# Patient Record
Sex: Male | Born: 1985 | Race: Black or African American | Hispanic: No | Marital: Single | Smoking: Never smoker
Health system: Southern US, Community
[De-identification: ages and names within clinical notes are randomized; demographics above are authoritative.]

---
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
## 2009-12-22 ENCOUNTER — Ambulatory Visit: Payer: Self-pay | Admitting: Internal Medicine

## 2009-12-22 LAB — CONVERTED CEMR LAB
Cholesterol: 118 mg/dL (ref 0–200)
HDL: 33 mg/dL — ABNORMAL LOW (ref >39.00)
LDL Cholesterol: 78 mg/dL (ref 0–99)
Total CHOL/HDL Ratio: 4
Triglycerides: 37 mg/dL (ref 0.0–149.0)
VLDL: 7.4 mg/dL (ref 0.0–40.0)

## 2009-12-23 ENCOUNTER — Encounter: Payer: Self-pay | Admitting: Internal Medicine

## 2010-07-06 NOTE — Letter (Signed)
Summary: Lipid Letter  [facility]  [address] [place], [place] [address]   Phone: [phone]  Fax: [phone]    12/23/2009  Destan Franchini [address] [place], [place]  [address]  Dear Jamas Lav:  We have carefully reviewed your last lipid profile from 12/22/2009 and the results are noted below with a summary of recommendations for lipid management.    Cholesterol:       118     Goal: <200   HDL "good" Cholesterol:   57.84     Goal: >40   LDL "bad" Cholesterol:   78     Goal: <130   Triglycerides:       37.0     Goal: <150    these are excellent results    TLC Diet (Therapeutic Lifestyle Change): Saturated Fats & Transfatty acids should be kept < 7% of total calories ***Reduce Saturated Fats Polyunstaurated Fat can be up to 10% of total calories Monounsaturated Fat Fat can be up to 20% of total calories Total Fat should be no greater than 25-35% of total calories Carbohydrates should be 50-60% of total calories Protein should be approximately 15% of total calories Fiber should be at least 20-30 grams a day ***Increased fiber may help lower LDL Total Cholesterol should be < 200mg /day Consider adding plant stanol/sterols to diet (example: Benacol spread) ***A higher intake of unsaturated fat may reduce Triglycerides and Increase HDL    Adjunctive Measures (may lower LIPIDS and reduce risk of Heart Attack) include: Aerobic Exercise (20-30 minutes 3-4 times a week) Limit Alcohol Consumption Weight Reduction Aspirin 75-81 mg a day by mouth (if not allergic or contraindicated) Dietary Fiber 20-30 grams a day by mouth     Current Medications:  None If you have any questions, please call. We appreciate being able to work with you.   Sincerely,    [facility] Etta Grandchild MD

## 2010-07-06 NOTE — Assessment & Plan Note (Signed)
Summary: new pt cpx-lb   Vital Signs:  Patient profile:   25 year old male Height:      67 inches Weight:      180.25 pounds BMI:     28.33 O2 Sat:      98 % Temp:     97.4 degrees F oral Pulse rate:   55 / minute Pulse rhythm:   regular Resp:     16 per minute BP sitting:   112 / 70  (left arm) Cuff size:   large  Vitals Entered By: Rock Nephew CMA (December 22, 2009 10:43 AM)  Nutrition Counseling: Patient's BMI is greater than 25 and therefore counseled on weight management options. CC: CPX w/ lab/ Is Patient Diabetic? No Pain Assessment Patient in pain? no        Primary Care Provider:  Etta Grandchild MD  CC:  CPX w/ lab/.  History of Present Illness: New to me for a complete physical - no complaints.  Preventive Screening-Counseling & Management  Alcohol-Tobacco     Alcohol drinks/day: <1     Alcohol type: beer     >5/day in last 3 mos: no     Alcohol Counseling: not indicated; use of alcohol is not excessive or problematic     Feels need to cut down: no     Feels annoyed by complaints: no     Feels guilty re: drinking: no     Needs 'eye opener' in am: no     Smoking Status: never  Caffeine-Diet-Exercise     Does Patient Exercise: yes  Hep-HIV-STD-Contraception     Hepatitis Risk: no risk noted     HIV Risk: no risk noted     STD Risk: no risk noted     TSE monthly: yes     Testicular SE Education/Counseling to perform regular STE  Safety-Violence-Falls     Seat Belt Use: yes     Helmet Use: yes     Firearms in the Home: firearms in the home     Smoke Detectors: yes     Violence in the Home: no risk noted     Sexual Abuse: no      Sexual History:  currently monogamous.        Drug Use:  no.        Blood Transfusions:  no.    Current Medications (verified): 1)  None  Allergies (verified): No Known Drug Allergies  Past History:  Past Medical History: Unremarkable  Past Surgical History: Denies surgical history  Family  History: Family History Diabetes 1st degree relative Family History Hypertension  Social History: Occupation: IE Consulting civil engineer at [employer] Never Smoked Alcohol use-yes Drug use-no Regular exercise-yes Smoking Status:  never Hepatitis Risk:  no risk noted HIV Risk:  no risk noted STD Risk:  no risk noted Seat Belt Use:  yes Sexual History:  currently monogamous Blood Transfusions:  no Does Patient Exercise:  yes Drug Use:  no  Review of Systems  The patient denies anorexia, fever, weight loss, weight gain, chest pain, syncope, dyspnea on exertion, peripheral edema, prolonged cough, headaches, hemoptysis, abdominal pain, suspicious skin lesions, difficulty walking, depression, enlarged lymph nodes, and testicular masses.    Physical Exam  General:  Well-developed,well-nourished,in no acute distress; alert,appropriate and cooperative throughout examination Head:  Normocephalic and atraumatic without obvious abnormalities. No apparent alopecia or balding. Eyes:  No corneal or conjunctival inflammation noted. EOMI. Perrla. Funduscopic exam benign, without hemorrhages, exudates  or papilledema. Vision grossly normal. Ears:  External ear exam shows no significant lesions or deformities.  Otoscopic examination reveals clear canals, tympanic membranes are intact bilaterally without bulging, retraction, inflammation or discharge. Hearing is grossly normal bilaterally. Mouth:  Oral mucosa and oropharynx without lesions or exudates.  Teeth in good repair. Neck:  No deformities, masses, or tenderness noted. Lungs:  Normal respiratory effort, chest expands symmetrically. Lungs are clear to auscultation, no crackles or wheezes. Heart:  Normal rate and regular rhythm. S1 and S2 normal without gallop, murmur, click, rub or other extra sounds. Abdomen:  soft, non-tender, normal bowel sounds, no distention, no masses, no guarding, no rigidity, no rebound tenderness, no abdominal hernia, no inguinal  hernia, no hepatomegaly, and no splenomegaly.   Genitalia:  circumcised, no hydrocele, no varicocele, no scrotal masses, no testicular masses or atrophy, no cutaneous lesions, and no urethral discharge.   Msk:  normal ROM, no joint tenderness, no joint swelling, no joint warmth, no redness over joints, no joint deformities, no joint instability, no crepitation, and no muscle atrophy.   Pulses:  R and L carotid,radial,femoral,dorsalis pedis and posterior tibial pulses are full and equal bilaterally Extremities:  No clubbing, cyanosis, edema, or deformity noted with normal full range of motion of all joints.   Neurologic:  No cranial nerve deficits noted. Station and gait are normal. Plantar reflexes are down-going bilaterally. DTRs are symmetrical throughout. Sensory, motor and coordinative functions appear intact. Skin:  turgor normal, color normal, no rashes, no suspicious lesions, no ecchymoses, no petechiae, no purpura, no ulcerations, no edema, and tattoo(s).   Cervical Nodes:  no anterior cervical adenopathy and no posterior cervical adenopathy.   Axillary Nodes:  no R axillary adenopathy and no L axillary adenopathy.   Inguinal Nodes:  no R inguinal adenopathy and no L inguinal adenopathy.   Psych:  Cognition and judgment appear intact. Alert and cooperative with normal attention span and concentration. No apparent delusions, illusions, hallucinations   Impression & Recommendations:  Problem # 1:  ROUTINE GENERAL MEDICAL EXAM@HEALTH  CARE FACL (ICD-V70.0) Assessment New  Orders: Venipuncture (36644) TLB-Lipid Panel (80061-LIPID)  Td Booster: Historical (06/06/2002)    Discussed using sunscreen, use of alcohol, drug use, self testicular exam, routine dental care, routine eye care, routine physical exam, seat belts, multiple vitamins, and recommendations for immunizations.  Discussed exercise and checking cholesterol.    Patient Instructions: 1)  Please schedule a follow-up appointment  as needed. 2)  If you could be exposed to sexually transmitted diseases, you should use a condom.  Preventive Care Screening  Last Tetanus Booster:    Date:  06/06/2002    Results:  Historical

## 2012-03-18 ENCOUNTER — Ambulatory Visit (INDEPENDENT_AMBULATORY_CARE_PROVIDER_SITE_OTHER): Payer: BC Managed Care – PPO | Admitting: Family Medicine

## 2012-03-18 VITALS — BP 102/62 | HR 70 | Temp 98.4°F | Resp 16 | Ht 67.0 in | Wt 178.0 lb

## 2012-03-18 DIAGNOSIS — R300 Dysuria: Secondary | ICD-10-CM

## 2012-03-18 DIAGNOSIS — N390 Urinary tract infection, site not specified: Secondary | ICD-10-CM

## 2012-03-18 LAB — POCT URINALYSIS DIPSTICK
Bilirubin, UA: NEGATIVE
Blood, UA: NEGATIVE
Glucose, UA: NEGATIVE
Ketones, UA: NEGATIVE
Leukocytes, UA: NEGATIVE
Nitrite, UA: NEGATIVE
Protein, UA: NEGATIVE
Spec Grav, UA: 1.02
Urobilinogen, UA: 1
pH, UA: 7

## 2012-03-18 LAB — POCT UA - MICROSCOPIC ONLY
Casts, Ur, LPF, POC: NEGATIVE
Crystals, Ur, HPF, POC: NEGATIVE
Yeast, UA: NEGATIVE

## 2012-03-18 MED ORDER — CIPROFLOXACIN HCL 500 MG PO TABS: 500.0000 mg | ORAL_TABLET | Freq: Two times a day (BID) | ORAL | Status: DC

## 2012-03-18 NOTE — Progress Notes (Signed)
Subjective:    Patient ID: Dean Parrish, male    DOB: 11-05-1985, 26 y.o.   MRN: [id number]  HPI  Has had several wks of pain in urethra w/ urination, intermittent. worse when he has to urinate badly - when his bladder feels very full, it is more painful to urinate.  He can almost feel a certain point in his urethra where the pain is but no other sxs. doesn't feel as bad when he drinks cranberry juice.  Has had 2-3x in the morning of some white discharge on meatus but was after sex so he just assumed it was dried semun.  Uses condoms everytime - very religious about this. Had neg STI test at [facility] about 3 mos prev - was tested through urine.  He did recently change back to latex condoms from polyurethane and one night before sxs started he fell asleep w/ the condom on all night. Has had a swollen left inguinal node for sev mos but not tender or enlarging  Had genital probe test done sev yrs ago and was very painful for pt.  He uses condoms everytime and declines having this test done again.  No past medical history on file.   Review of Systems  Constitutional: Negative for fever, chills, diaphoresis, activity change, appetite change, fatigue and unexpected weight change.  Gastrointestinal: Negative for nausea, vomiting, abdominal pain, diarrhea and constipation.  Genitourinary: Positive for dysuria and discharge. Negative for urgency, frequency, hematuria, flank pain, decreased urine volume, penile swelling, scrotal swelling, enuresis, difficulty urinating, genital sores, penile pain and testicular pain.  Skin: Negative for color change and rash.  Hematological: Positive for adenopathy. Does not bruise/bleed easily.       BP 102/62  Pulse 70  Temp 98.4 F (36.9 C) (Oral)  Resp 16  Ht 5\' 7"  (1.702 m)  Wt 178 lb (80.74 kg)  BMI 27.88 kg/m2  SpO2 100%  Objective:   Physical Exam  Constitutional: He is oriented to person, place, and time. He appears well-developed and well-nourished. No  distress.  HENT:  Head: Normocephalic and atraumatic.  Right Ear: External ear normal.  Left Ear: External ear normal.  Eyes: Conjunctivae normal are normal. No scleral icterus.  Neck: Normal range of motion. Neck supple.  Cardiovascular: Normal rate and regular rhythm.   Pulmonary/Chest: Effort normal and breath sounds normal.  Abdominal: There is no CVA tenderness.  Musculoskeletal: He exhibits no edema.  Neurological: He is alert and oriented to person, place, and time.  Skin: Skin is warm and dry. No rash noted. He is not diaphoretic.  Psychiatric: He has a normal mood and affect. His behavior is normal.          Results for orders placed in visit on 03/18/12  POCT UA - MICROSCOPIC ONLY      Component Value Range   WBC, Ur, HPF, POC 8-12     RBC, urine, microscopic 0-2     Bacteria, U Microscopic small     Mucus, UA trace     Epithelial cells, urine per micros 2-3     Crystals, Ur, HPF, POC neg     Casts, Ur, LPF, POC neg     Yeast, UA neg    POCT URINALYSIS DIPSTICK      Component Value Range   Color, UA yellow     Clarity, UA clear     Glucose, UA neg     Bilirubin, UA neg     Ketones, UA neg  Spec Grav, UA 1.020     Blood, UA neg     pH, UA 7.0     Protein, UA neg     Urobilinogen, UA 1.0     Nitrite, UA neg     Leukocytes, UA Negative      Assessment & Plan:  1. Urethritis - Cover w/ cipro which urine culture is pending. Thoroughly discussed potential for false negative results on urine sti testing but pt declines repeat testing w/ probe. He will RTC after completing abx for repeat gc/chlam urine testing with dirty catch of first a.m. void.  Cont to use condoms everytime.

## 2012-03-19 LAB — URINE CULTURE
Colony Count: NO GROWTH
Organism ID, Bacteria: NO GROWTH

## 2012-03-21 ENCOUNTER — Emergency Department (HOSPITAL_COMMUNITY)
Admission: EM | Admit: 2012-03-21 | Discharge: 2012-03-22 | Disposition: A | Discharge status: Home / Self Care | Payer: BC Managed Care – PPO | Attending: Emergency Medicine | Admitting: Emergency Medicine

## 2012-03-21 ENCOUNTER — Encounter (HOSPITAL_COMMUNITY): Payer: Self-pay | Admitting: *Deleted

## 2012-03-21 DIAGNOSIS — A64 Unspecified sexually transmitted disease: Secondary | ICD-10-CM | POA: Insufficient documentation

## 2012-03-21 DIAGNOSIS — N342 Other urethritis: Secondary | ICD-10-CM

## 2012-03-21 LAB — URINALYSIS, ROUTINE W REFLEX MICROSCOPIC
Bilirubin Urine: NEGATIVE
Glucose, UA: NEGATIVE mg/dL
Hgb urine dipstick: NEGATIVE
Ketones, ur: NEGATIVE mg/dL
Nitrite: NEGATIVE
Protein, ur: NEGATIVE mg/dL
Specific Gravity, Urine: 1.024 (ref 1.005–1.030)
Urobilinogen, UA: 0.2 mg/dL (ref 0.0–1.0)
pH: 6.5 (ref 5.0–8.0)

## 2012-03-21 LAB — URINE MICROSCOPIC-ADD ON

## 2012-03-21 NOTE — ED Notes (Signed)
Painful urnation for 1-2 weeks.  He was seen at [facility] Sunday and he was given an antibiotic and he has not yet finished it yet

## 2012-03-22 MED ORDER — CEFTRIAXONE SODIUM 250 MG IJ SOLR
250.0000 mg | Freq: Once | INTRAMUSCULAR | Status: AC
  Administered 2012-03-22: 250 mg via INTRAMUSCULAR
  Filled 2012-03-22: qty 250

## 2012-03-22 MED ORDER — LIDOCAINE HCL (PF) 1 % IJ SOLN
INTRAMUSCULAR | Status: AC
  Administered 2012-03-22: 5 mL
  Filled 2012-03-22: qty 5

## 2012-03-22 MED ORDER — METRONIDAZOLE 500 MG PO TABS
2000.0000 mg | ORAL_TABLET | Freq: Once | ORAL | Status: AC
  Administered 2012-03-22: 2000 mg via ORAL
  Filled 2012-03-22: qty 1

## 2012-03-22 MED ORDER — AZITHROMYCIN 250 MG PO TABS
1000.0000 mg | ORAL_TABLET | Freq: Once | ORAL | Status: AC
  Administered 2012-03-22: 1000 mg via ORAL
  Filled 2012-03-22: qty 4

## 2012-03-22 NOTE — ED Provider Notes (Signed)
Medical screening examination/treatment/procedure(s) were performed by non-physician practitioner and as supervising physician I was immediately available for consultation/collaboration.  Levante Simones L Ellaree Gear, MD 03/22/12 0640 

## 2012-03-22 NOTE — ED Provider Notes (Signed)
History     CSN: [id number]  Arrival date & time 03/21/12  2158   First MD Initiated Contact with Patient 03/21/12 2347      Chief Complaint  Patient presents with  . painful urination    HPI   history provided by the patient. Patient is a 26 year old male with no significant PMH who presents with complaints of dysuria and penile discharge. Symptoms present for the past one to 2 weeks. Patient states he was seen in urgent care Center last Sunday given a prescription for an antibiotic but states he has had no improvement in symptoms. Patient is sexually active with no prior history of STDs. He denies any rash of the skin. He denies any hematuria. Denies any flank pain. Denies any fever, chills or sweats. He denies any other aggravating or alleviating factors.    History reviewed. No pertinent past medical history.  History reviewed. No pertinent past surgical history.  No family history on file.  History  Substance Use Topics  . Smoking status: Never Smoker   . Smokeless tobacco: Not on file  . Alcohol Use: Yes     socially      Review of Systems  Constitutional: Negative for fever and chills.  Gastrointestinal: Negative for nausea, vomiting and abdominal pain.  Genitourinary: Positive for dysuria and discharge. Negative for frequency, hematuria, flank pain, genital sores and penile pain.    Allergies  Review of patient's allergies indicates no known allergies.  Home Medications   Current Outpatient Rx  Name Route Sig Dispense Refill  . CIPROFLOXACIN HCL 500 MG PO TABS Oral Take 1 tablet (500 mg total) by mouth 2 (two) times daily. 28 tablet 0    BP 112/58  Pulse 68  Temp 98.5 F (36.9 C) (Oral)  Resp 18  SpO2 98%  Physical Exam  Nursing note and vitals reviewed. Constitutional: He is oriented to person, place, and time. He appears well-developed and well-nourished. No distress.  HENT:  Head: Normocephalic.  Cardiovascular: Normal rate and regular rhythm.    Pulmonary/Chest: Effort normal and breath sounds normal.  Abdominal: Soft. There is no tenderness. There is no rebound and no guarding.       No CVA tenderness  Genitourinary: Right testis shows no mass and no tenderness. Left testis shows no mass and no tenderness. Circumcised. Discharge found.  Lymphadenopathy:       Right: No inguinal adenopathy present.       Left: Inguinal adenopathy present.  Neurological: He is alert and oriented to person, place, and time.  Skin: Skin is warm. No rash noted.  Psychiatric: He has a normal mood and affect. His behavior is normal.    ED Course  Procedures   Results for orders placed during the hospital encounter of 03/21/12  URINALYSIS, ROUTINE W REFLEX MICROSCOPIC      Component Value Range   Color, Urine YELLOW  YELLOW   APPearance CLEAR  CLEAR   Specific Gravity, Urine 1.024  1.005 - 1.030   pH 6.5  5.0 - 8.0   Glucose, UA NEGATIVE  NEGATIVE mg/dL   Hgb urine dipstick NEGATIVE  NEGATIVE   Bilirubin Urine NEGATIVE  NEGATIVE   Ketones, ur NEGATIVE  NEGATIVE mg/dL   Protein, ur NEGATIVE  NEGATIVE mg/dL   Urobilinogen, UA 0.2  0.0 - 1.0 mg/dL   Nitrite NEGATIVE  NEGATIVE   Leukocytes, UA SMALL (*) NEGATIVE  URINE MICROSCOPIC-ADD ON      Component Value Range   Squamous Epithelial /  LPF RARE  RARE   WBC, UA 21-50  <3 WBC/hpf   Bacteria, UA RARE  RARE        1. Sexually transmitted disease (STD)   2. Urethritis       MDM  Patient seen and evaluated. Patient no acute distress and well appearing. Patient is nontoxic.   Patient with symptoms urethritis and penile discharge. Will treat for STD. Patient advised continue Cipro.     Angus Seller, [place] 03/22/12 [phone]

## 2012-03-23 LAB — GC/CHLAMYDIA PROBE AMP, URINE
Chlamydia, Swab/Urine, PCR: NEGATIVE
GC Probe Amp, Urine: NEGATIVE

## 2012-03-23 LAB — URINE CULTURE

## 2012-04-03 ENCOUNTER — Ambulatory Visit (INDEPENDENT_AMBULATORY_CARE_PROVIDER_SITE_OTHER): Payer: BC Managed Care – PPO | Admitting: Family Medicine

## 2012-04-03 VITALS — BP 110/70 | HR 81 | Temp 98.7°F | Resp 18 | Wt 177.0 lb

## 2012-04-03 DIAGNOSIS — Z711 Person with feared health complaint in whom no diagnosis is made: Secondary | ICD-10-CM

## 2012-04-03 DIAGNOSIS — N342 Other urethritis: Secondary | ICD-10-CM

## 2012-04-03 NOTE — Progress Notes (Signed)
 [facility]:  Office Visit  Chief Complaint:  Chief Complaint  Patient presents with  . Urinary Tract Infection  . Follow-up    HPI: Dean Parrish is a 26 y.o.hetrosexual African American  male who complains of  Recheck for STD. He was seen by Dr. Clelia Croft for urethritis , was given Cipro and was asked to return for a dirty urine collection to check for G/C but never returned. He then went to ER 3 days later since he still had sxs  and was tested for [facility] for the same problems and the ED gave him treatment for [facility] and also trichomonas. He received Rocephin 250 mg IM x 1, Azithromycin 1 gram x 1 and also Flagyl 2 grams x 1. His urine GC done in ER was negative. He is wondering today if he needs to be retested.  He is currently asymptomatic.  STD hx-None Uses condoms 90% of the time Has multiple partners, but sounds like he has the same steady 2 partners.   History reviewed. No pertinent past medical history. History reviewed. No pertinent past surgical history. History   Social History  . Marital Status: Single    Spouse Name: N/A    Number of Children: N/A  . Years of Education: N/A   Social History Main Topics  . Smoking status: Never Smoker   . Smokeless tobacco: None  . Alcohol Use: Yes     socially  . Drug Use: Yes     marijuana - socially  . Sexually Active: Yes    Birth Control/ Protection: Condom   Other Topics Concern  . None   Social History Narrative  . None   History reviewed. No pertinent family history. No Known Allergies Prior to Admission medications   Medication Sig Start Date End Date Taking? Authorizing Provider  ciprofloxacin (CIPRO) 500 MG tablet Take 1 tablet (500 mg total) by mouth 2 (two) times daily. 03/18/12   Sherren Mocha, MD     ROS: The patient denies fevers, chills, night sweats, unintentional weight loss, chest pain, palpitations, wheezing, dyspnea on exertion, nausea, vomiting, abdominal pain, dysuria, hematuria, melena,  numbness, weakness, or tingling.  All other systems have been reviewed and were otherwise negative with the exception of those mentioned in the HPI and as above.    PHYSICAL EXAM: Filed Vitals:   04/03/12 1341  BP: 110/70  Pulse: 81  Temp: 98.7 F (37.1 C)  Resp: 18   Filed Vitals:   04/03/12 1341  Weight: 177 lb (80.287 kg)   There is no height on file to calculate BMI.  General: Alert, no acute distress HEENT:  Normocephalic, atraumatic, oropharynx patent.  Cardiovascular:  Regular rate and rhythm, no rubs murmurs or gallops.  No Carotid bruits, radial pulse intact. No pedal edema.  Respiratory: Clear to auscultation bilaterally.  No wheezes, rales, or rhonchi.  No cyanosis, no use of accessory musculature GI: No organomegaly, abdomen is soft and non-tender, positive bowel sounds.  No masses. Skin: No rashes. Neurologic: Facial musculature symmetric. Psychiatric: Patient is appropriate throughout our interaction. Lymphatic: No cervical lymphadenopathy Musculoskeletal: Gait intact.   LABS: Results for orders placed during the hospital encounter of 03/21/12  URINALYSIS, ROUTINE W REFLEX MICROSCOPIC      Component Value Range   Color, Urine YELLOW  YELLOW   APPearance CLEAR  CLEAR   Specific Gravity, Urine 1.024  1.005 - 1.030   pH 6.5  5.0 - 8.0   Glucose, UA NEGATIVE  NEGATIVE mg/dL   Hgb urine dipstick NEGATIVE  NEGATIVE   Bilirubin Urine NEGATIVE  NEGATIVE   Ketones, ur NEGATIVE  NEGATIVE mg/dL   Protein, ur NEGATIVE  NEGATIVE mg/dL   Urobilinogen, UA 0.2  0.0 - 1.0 mg/dL   Nitrite NEGATIVE  NEGATIVE   Leukocytes, UA SMALL (*) NEGATIVE  URINE MICROSCOPIC-ADD ON      Component Value Range   Squamous Epithelial / LPF RARE  RARE   WBC, UA 21-50  <3 WBC/hpf   Bacteria, UA RARE  RARE  GC/CHLAMYDIA PROBE AMP, URINE      Component Value Range   GC Probe Amp, Urine NEGATIVE  NEGATIVE   Chlamydia, Swab/Urine, PCR NEGATIVE  NEGATIVE  URINE CULTURE      Component Value  Range   Specimen Description URINE, RANDOM     Special Requests NONE     Culture  Setup Time 03/22/2012 08:48     Colony Count NO GROWTH     Culture NO GROWTH     Report Status 03/23/2012 FINAL       EKG/XRAY:   Primary read interpreted by Dr. Conley Rolls at [facility].   ASSESSMENT/PLAN: Encounter Diagnoses  Name Primary?  . Concern about STD in male without diagnosis Yes  . Urethritis    We had a long discussion about if he needed to be retested. I advise that the best test if any would be the urethral swab for GC but since he already has been treated and his GC urine form ER prior to treatment was negative, it might be just a waste of money. It  would behoove him to get retested for all STDs in the next several months since one of the "condom broke" during intercourse a couple of weeks back. He has had STD testing done before and it was negative for HIV.     ,  PHUONG, DO 04/04/2012 3:53 PM

## 2012-04-04 ENCOUNTER — Encounter: Payer: Self-pay | Admitting: Family Medicine

## 2013-05-27 ENCOUNTER — Other Ambulatory Visit (INDEPENDENT_AMBULATORY_CARE_PROVIDER_SITE_OTHER): Payer: BC Managed Care – PPO

## 2013-05-27 ENCOUNTER — Encounter: Payer: Self-pay | Admitting: Internal Medicine

## 2013-05-27 ENCOUNTER — Ambulatory Visit (INDEPENDENT_AMBULATORY_CARE_PROVIDER_SITE_OTHER): Payer: BC Managed Care – PPO | Admitting: Internal Medicine

## 2013-05-27 VITALS — BP 110/68 | HR 64 | Temp 97.5°F | Resp 16 | Ht 66.0 in | Wt 186.0 lb

## 2013-05-27 DIAGNOSIS — Z0000 Encounter for general adult medical examination without abnormal findings: Secondary | ICD-10-CM

## 2013-05-27 DIAGNOSIS — S8990XA Unspecified injury of unspecified lower leg, initial encounter: Secondary | ICD-10-CM | POA: Insufficient documentation

## 2013-05-27 DIAGNOSIS — S8992XA Unspecified injury of left lower leg, initial encounter: Secondary | ICD-10-CM

## 2013-05-27 LAB — CBC WITH DIFFERENTIAL/PLATELET
Basophils Relative: 0.5 % (ref 0.0–3.0)
Eosinophils Absolute: 0.1 10*3/uL (ref 0.0–0.7)
MCHC: 33.6 g/dL (ref 30.0–36.0)
MCV: 91.1 fl (ref 78.0–100.0)
Monocytes Absolute: 0.6 10*3/uL (ref 0.1–1.0)
Neutrophils Relative %: 48 % (ref 43.0–77.0)
RBC: 4.75 Mil/uL (ref 4.22–5.81)

## 2013-05-27 LAB — URINALYSIS, ROUTINE W REFLEX MICROSCOPIC
Bilirubin Urine: NEGATIVE
Hgb urine dipstick: NEGATIVE
Ketones, ur: NEGATIVE
Nitrite: NEGATIVE
Total Protein, Urine: NEGATIVE
Urine Glucose: NEGATIVE

## 2013-05-27 LAB — LIPID PANEL
LDL Cholesterol: 72 mg/dL (ref 0–99)
Total CHOL/HDL Ratio: 3
VLDL: 6.8 mg/dL (ref 0.0–40.0)

## 2013-05-27 LAB — COMPREHENSIVE METABOLIC PANEL
AST: 26 U/L (ref 0–37)
Albumin: 4.4 g/dL (ref 3.5–5.2)
Alkaline Phosphatase: 47 U/L (ref 39–117)
Potassium: 3.7 mEq/L (ref 3.5–5.1)
Sodium: 139 mEq/L (ref 135–145)
Total Protein: 7 g/dL (ref 6.0–8.3)

## 2013-05-27 NOTE — Patient Instructions (Signed)
Health Maintenance, Males A healthy lifestyle and preventative care can promote health and wellness.  Maintain regular health, dental, and eye exams.  Eat a healthy diet. Foods like vegetables, fruits, whole grains, low-fat dairy products, and lean protein foods contain the nutrients you need without too many calories. Decrease your intake of foods high in solid fats, added sugars, and salt. Get information about a proper diet from your caregiver, if necessary.  Regular physical exercise is one of the most important things you can do for your health. Most adults should get at least 150 minutes of moderate-intensity exercise (any activity that increases your heart rate and causes you to sweat) each week. In addition, most adults need muscle-strengthening exercises on 2 or more days a week.   Maintain a healthy weight. The body mass index (BMI) is a screening tool to identify possible weight problems. It provides an estimate of body fat based on height and weight. Your caregiver can help determine your BMI, and can help you achieve or maintain a healthy weight. For adults 20 years and older:  A BMI below 18.5 is considered underweight.  A BMI of 18.5 to 24.9 is normal.  A BMI of 25 to 29.9 is considered overweight.  A BMI of 30 and above is considered obese.  Maintain normal blood lipids and cholesterol by exercising and minimizing your intake of saturated fat. Eat a balanced diet with plenty of fruits and vegetables. Blood tests for lipids and cholesterol should begin at age 20 and be repeated every 5 years. If your lipid or cholesterol levels are high, you are over 50, or you are a high risk for heart disease, you may need your cholesterol levels checked more frequently.Ongoing high lipid and cholesterol levels should be treated with medicines, if diet and exercise are not effective.  If you smoke, find out from your caregiver how to quit. If you do not use tobacco, do not start.  Lung  cancer screening is recommended for adults aged 55 80 years who are at high risk for developing lung cancer because of a history of smoking. Yearly low-dose computed tomography (CT) is recommended for people who have at least a 30-pack-year history of smoking and are a current smoker or have quit within the past 15 years. A pack year of smoking is smoking an average of 1 pack of cigarettes a day for 1 year (for example: 1 pack a day for 30 years or 2 packs a day for 15 years). Yearly screening should continue until the smoker has stopped smoking for at least 15 years. Yearly screening should also be stopped for people who develop a health problem that would prevent them from having lung cancer treatment.  If you choose to drink alcohol, do not exceed 2 drinks per day. One drink is considered to be 12 ounces (355 mL) of beer, 5 ounces (148 mL) of wine, or 1.5 ounces (44 mL) of liquor.  Avoid use of street drugs. Do not share needles with anyone. Ask for help if you need support or instructions about stopping the use of drugs.  High blood pressure causes heart disease and increases the risk of stroke. Blood pressure should be checked at least every 1 to 2 years. Ongoing high blood pressure should be treated with medicines if weight loss and exercise are not effective.  If you are 45 to 28 years old, ask your caregiver if you should take aspirin to prevent heart disease.  Diabetes screening involves taking a blood   sample to check your fasting blood sugar level. This should be done once every 3 years, after age 45, if you are within normal weight and without risk factors for diabetes. Testing should be considered at a younger age or be carried out more frequently if you are overweight and have at least 1 risk factor for diabetes.  Colorectal cancer can be detected and often prevented. Most routine colorectal cancer screening begins at the age of 50 and continues through age 75. However, your caregiver may  recommend screening at an earlier age if you have risk factors for colon cancer. On a yearly basis, your caregiver may provide home test kits to check for hidden blood in the stool. Use of a small camera at the end of a tube, to directly examine the colon (sigmoidoscopy or colonoscopy), can detect the earliest forms of colorectal cancer. Talk to your caregiver about this at age 50, when routine screening begins. Direct examination of the colon should be repeated every 5 to 10 years through age 75, unless early forms of pre-cancerous polyps or small growths are found.  Hepatitis C blood testing is recommended for all people born from 1945 through 1965 and any individual with known risks for hepatitis C.  Healthy men should no longer receive prostate-specific antigen (PSA) blood tests as part of routine cancer screening. Consult with your caregiver about prostate cancer screening.  Testicular cancer screening is not recommended for adolescents or adult males who have no symptoms. Screening includes self-exam, caregiver exam, and other screening tests. Consult with your caregiver about any symptoms you have or any concerns you have about testicular cancer.  Practice safe sex. Use condoms and avoid high-risk sexual practices to reduce the spread of sexually transmitted infections (STIs).  Use sunscreen. Apply sunscreen liberally and repeatedly throughout the day. You should seek shade when your shadow is shorter than you. Protect yourself by wearing long sleeves, pants, a wide-brimmed hat, and sunglasses year round, whenever you are outdoors.  Notify your caregiver of new moles or changes in moles, especially if there is a change in shape or color. Also notify your caregiver if a mole is larger than the size of a pencil eraser.  A one-time screening for abdominal aortic aneurysm (AAA) and surgical repair of large AAAs by sound wave imaging (ultrasonography) is recommended for ages 65 to 75 years who are  current or former smokers.  Stay current with your immunizations. Document Released: 11/19/2007 Document Revised: 09/17/2012 Document Reviewed: 10/18/2010 [employer] Patient Information 2014 [employer].  

## 2013-05-27 NOTE — Assessment & Plan Note (Signed)
I have asked him to see sports med about this

## 2013-05-27 NOTE — Progress Notes (Signed)
Pre visit review using our clinic review tool, if applicable. No additional management support is needed unless otherwise documented below in the visit note. 

## 2013-05-27 NOTE — Progress Notes (Signed)
   Subjective:    Patient ID: Dean Parrish, male    DOB: January 10, 1986, 27 y.o.   MRN: [id number]  HPI Comments: He returns for a physical but he also complains that he hurt his left knee about 3 months ago and still have pain in the left knee.     Review of Systems  Constitutional: Negative.   HENT: Negative.   Eyes: Negative.   Respiratory: Negative.   Cardiovascular: Negative.   Gastrointestinal: Negative.   Endocrine: Negative.   Genitourinary: Negative.   Musculoskeletal: Positive for arthralgias (left knee only).  Skin: Negative.   Allergic/Immunologic: Negative.   Neurological: Negative.   Hematological: Negative.   Psychiatric/Behavioral: Negative.        Objective:   Physical Exam  Vitals reviewed. Constitutional: He is oriented to person, place, and time. He appears well-developed and well-nourished. No distress.  HENT:  Head: Normocephalic and atraumatic.  Mouth/Throat: Oropharynx is clear and moist. No oropharyngeal exudate.  Eyes: Conjunctivae are normal. Right eye exhibits no discharge. Left eye exhibits no discharge. No scleral icterus.  Neck: Normal range of motion. Neck supple. No JVD present. No tracheal deviation present. No thyromegaly present.  Cardiovascular: Normal rate, regular rhythm, normal heart sounds and intact distal pulses.  Exam reveals no gallop and no friction rub.   No murmur heard. Pulmonary/Chest: Effort normal and breath sounds normal. No stridor. No respiratory distress. He has no wheezes. He has no rales. He exhibits no tenderness.  Abdominal: Soft. Bowel sounds are normal. He exhibits no distension and no mass. There is no tenderness. There is no rebound and no guarding. Hernia confirmed negative in the right inguinal area and confirmed negative in the left inguinal area.  Genitourinary: Testes normal and penis normal. Right testis shows no mass, no swelling and no tenderness. Right testis is descended. Left testis shows no mass, no swelling  and no tenderness. Left testis is descended. Circumcised. No penile erythema or penile tenderness. No discharge found.  Musculoskeletal: Normal range of motion. He exhibits no edema and no tenderness.       Left knee: Normal. He exhibits normal range of motion, no swelling, no effusion, no ecchymosis, no deformity, no laceration, no erythema, normal alignment, no LCL laxity, normal patellar mobility and no bony tenderness. No tenderness found.  Lymphadenopathy:    He has no cervical adenopathy.       Right: No inguinal adenopathy present.       Left: No inguinal adenopathy present.  Neurological: He is oriented to person, place, and time.  Skin: Skin is warm and dry. No rash noted. He is not diaphoretic. No erythema. No pallor.  Psychiatric: He has a normal mood and affect. His behavior is normal. Judgment and thought content normal.          Assessment & Plan:

## 2013-05-27 NOTE — Assessment & Plan Note (Signed)
Exam done Labs ordered Vaccines were updated Pt ed material was given 

## 2013-06-07 ENCOUNTER — Ambulatory Visit: Payer: BC Managed Care – PPO | Admitting: Family Medicine

## 2013-06-11 ENCOUNTER — Encounter: Payer: Self-pay | Admitting: Family Medicine

## 2013-06-11 ENCOUNTER — Ambulatory Visit (INDEPENDENT_AMBULATORY_CARE_PROVIDER_SITE_OTHER): Payer: BC Managed Care – PPO | Admitting: Family Medicine

## 2013-06-11 ENCOUNTER — Other Ambulatory Visit (INDEPENDENT_AMBULATORY_CARE_PROVIDER_SITE_OTHER): Payer: BC Managed Care – PPO

## 2013-06-11 VITALS — BP 122/84 | HR 70 | Ht 67.0 in | Wt 191.0 lb

## 2013-06-11 DIAGNOSIS — IMO0002 Reserved for concepts with insufficient information to code with codable children: Secondary | ICD-10-CM

## 2013-06-11 DIAGNOSIS — M25562 Pain in left knee: Secondary | ICD-10-CM

## 2013-06-11 DIAGNOSIS — M25569 Pain in unspecified knee: Secondary | ICD-10-CM

## 2013-06-11 DIAGNOSIS — S76312A Strain of muscle, fascia and tendon of the posterior muscle group at thigh level, left thigh, initial encounter: Secondary | ICD-10-CM | POA: Insufficient documentation

## 2013-06-11 MED ORDER — MELOXICAM 15 MG PO TABS: 15.0000 mg | ORAL_TABLET | Freq: Every day | ORAL | Status: AC

## 2013-06-11 NOTE — Patient Instructions (Signed)
Great to meet you You have a hamstring pull that needs rehab Wear a compression sleeve on thigh with activity Ice after activity and heat before  Meloxicam daily for 10 days Do exercises on handout Come back in 4 weeks.

## 2013-06-11 NOTE — Assessment & Plan Note (Signed)
Patient discussed different treatment options and we'll continue with conservative therapy. Askling exercises given and showed proper technique.  Ice after activity Discuss compression sleeve for the thigh Discussed orthotics for pes planus Meloxicam burst.  Discussed limiting lifting to 50% at this time and increasing by 10% every 3 days. Patient will come back again in 4 weeks for further evaluation.

## 2013-06-11 NOTE — Progress Notes (Signed)
Pre-visit discussion using our clinic review tool. No additional management support is needed unless otherwise documented below in the visit note.  

## 2013-06-11 NOTE — Progress Notes (Signed)
  I'm seeing this patient by the request  of:  Dean Lingerhomas Jones, MD   CC: Knee injury  HPI: Patient is a pleasant 28 year old gentleman coming in with left knee pain. Patient states that he did injure this knee approximately 3 months ago and continues to have intermittent sharp pain. Mostly on the medial aspect of knee.  Does not give out on him.  No mechanical symptoms. Patient though states that he does have tightness of his hamstrings and the medial aspect of his knee with running or tried to sprint. This been going on for approximately 3 months. Patient states that he can do all other activities of daily living without any discomfort and denies any nighttime awakening. Denies any radiation or numbness. Patient states is more of a tightness and a soreness. He does respond ice and heat sometimes. No swelling noted. Has never injured this knee previously. 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  Past medical, surgical, family and social history reviewed. Medications reviewed all in the electronic medical record.   Review of Systems: No headache, visual changes, nausea, vomiting, diarrhea, constipation, dizziness, abdominal pain, skin rash, fevers, chills, night sweats, weight loss, swollen lymph nodes, body aches, joint swelling, muscle aches, chest pain, shortness of breath, mood changes.   Objective:    There were no vitals taken for this visit.   General: No apparent distress alert and oriented x3 mood and affect normal, dressed appropriately.  HEENT: Pupils equal, extraocular movements intact Respiratory: Patient's speak in full sentences and does not appear short of breath Cardiovascular: No lower extremity edema, non tender, no erythema Skin: Warm dry intact with no signs of infection or rash on extremities or on axial skeleton. Abdomen: Soft nontender Neuro: Cranial nerves II through XII are intact, neurovascularly intact in all extremities with 2+ DTRs and  2+ pulses. Lymph: No lymphadenopathy of posterior or anterior cervical chain or axillae bilaterally.  Gait normal with good balance and coordination.  MSK: Non tender with full range of motion and good stability and symmetric strength and tone of shoulders, elbows, wrist, hip,  and ankles bilaterally.  Knee: Normal to inspection with no erythema or effusion or obvious bony abnormalities. Palpation normal with no warmth, joint line tenderness, patellar tenderness, or condyle tenderness. Mild pain on distal hamstring medially and over pes ROM full in flexion and extension and lower leg rotation. Ligaments with solid consistent endpoints including ACL, PCL, LCL, MCL. Negative Mcmurray's, Apley's, and Thessalonian tests. Non painful patellar compression. Patellar glide without crepitus. Patellar and quadriceps tendons unremarkable. Hamstring tight but full strength and quadriceps strength is normal.  Contralateral knee unremarkable  MSK US performed of: Left knee This study was ordered, performed, and interpreted by Terrilee FilesZach Melis Trochez D.O.  Knee: All structures visualized. Anteromedial, anterolateral, posteromedial, and posterolateral menisci unremarkable without tearing, fraying, effusion, or displacement. Patellar Tendon unremarkable on long and transverse views without effusion. No abnormality of prepatellar bursa. LCL and MCL unremarkable on long and transverse views. No abnormality of origin of medial or lateral head of the gastrocnemius. Hamstring show some mild hypoechoic changes with mild increased Doppler flow but no tear appreciated  IMPRESSION:  Hamstring tendonopathy   Impression and Recommendations:     This case required medical decision making of moderate complexity.

## 2013-07-08 ENCOUNTER — Ambulatory Visit (INDEPENDENT_AMBULATORY_CARE_PROVIDER_SITE_OTHER): Payer: BC Managed Care – PPO | Admitting: Family Medicine

## 2013-07-08 ENCOUNTER — Encounter: Payer: Self-pay | Admitting: Family Medicine

## 2013-07-08 VITALS — BP 116/64 | HR 80 | Temp 98.2°F | Resp 16 | Wt 195.1 lb

## 2013-07-08 DIAGNOSIS — S76312A Strain of muscle, fascia and tendon of the posterior muscle group at thigh level, left thigh, initial encounter: Secondary | ICD-10-CM

## 2013-07-08 DIAGNOSIS — IMO0002 Reserved for concepts with insufficient information to code with codable children: Secondary | ICD-10-CM

## 2013-07-08 NOTE — Progress Notes (Signed)
  CC: Hamstring injury  HPI: Patient is a pleasant 28 year old gentleman coming in for followup of left hamstring strain. 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 Patient has been doing that for 4 weeks. Patient states he has not been wearing compression sleeve and only doing the exercises seldomly. Patient is not taking any medications and more. Patient states that it is better but still has some tightness with certain activities. Patient is not playing softball at the time. Patient though has been back at the gym doing more activity and states that it feels tight but is able to do more. Patient denies any weakness, any radiation of pain or any numbness. Overall he does think he is better but still not where he would like to be the  Past medical, surgical, family and social history reviewed. Medications reviewed all in the electronic medical record.   Review of Systems: No headache, visual changes, nausea, vomiting, diarrhea, constipation, dizziness, abdominal pain, skin rash, fevers, chills, night sweats, weight loss, swollen lymph nodes, body aches, joint swelling, muscle aches, chest pain, shortness of breath, mood changes.   Objective:    Blood pressure 116/64, pulse 80, temperature 98.2 F (36.8 C), temperature source Oral, resp. rate 16, weight 195 lb 1.3 oz (88.488 kg), SpO2 97.00%.   General: No apparent distress alert and oriented x3 mood and affect normal, dressed appropriately.  HEENT: Pupils equal, extraocular movements intact Respiratory: Patient's speak in full sentences and does not appear short of breath Cardiovascular: No lower extremity edema, non tender, no erythema Skin: Warm dry intact with no signs of infection or rash on extremities or on axial skeleton. Abdomen: Soft nontender Neuro: Cranial nerves II through XII are intact, neurovascularly intact in all extremities  with 2+ DTRs and 2+ pulses. Lymph: No lymphadenopathy of posterior or anterior cervical chain or axillae bilaterally.  Gait normal with good balance and coordination.  MSK: Non tender with full range of motion and good stability and symmetric strength and tone of shoulders, elbows, wrist, hip,  and ankles bilaterally.  Knee: Left Normal to inspection with no erythema or effusion or obvious bony abnormalities. Palpation normal with no warmth, joint line tenderness, patellar tenderness, or condyle tenderness. Mild pain on distal hamstring medially and over pes ROM full in flexion and extension and lower leg rotation. Ligaments with solid consistent endpoints including ACL, PCL, LCL, MCL. Negative Mcmurray's, Apley's, and Thessalonian tests. Non painful patellar compression. Patellar glide without crepitus. Patellar and quadriceps tendons unremarkable. Hamstring still  tight but full strength and quadriceps strength is normal.  Contralateral knee unremarkable    Impression and Recommendations:     This case required medical decision making of moderate complexity.

## 2013-07-08 NOTE — Assessment & Plan Note (Signed)
Patient is improving slowly but is also not doing the rehabilitation a regular basis. Encourage him to do it at least 3 times a week. Discuss the importance a compression and icing. Patient will try these interventions and an followup in 4-6 weeks. Patient is to avoid any explosive exercises at this time but hopefully in 4-6 weeks we'll get him back to full activity. Please see patient instructions for further detail.

## 2013-07-08 NOTE — Patient Instructions (Signed)
Good to see you It looks 80% better Avoid sprinting for another month.  Ice 20 minutes after exercises Compression sleeve will be good.  No explosive exercises no plyo.  At gym focue on eccentric part of lifting.  Try to do exercises fairly regular and add Nordic hamstring strengtheing exercises.  Com eback again in 4-6 weeks. At that point you should be good to go.

## 2017-04-13 DIAGNOSIS — Z136 Encounter for screening for cardiovascular disorders: Secondary | ICD-10-CM | POA: Diagnosis not present

## 2017-04-13 DIAGNOSIS — Z0001 Encounter for general adult medical examination with abnormal findings: Secondary | ICD-10-CM | POA: Diagnosis not present

## 2017-04-13 DIAGNOSIS — Z23 Encounter for immunization: Secondary | ICD-10-CM | POA: Diagnosis not present

## 2017-04-13 DIAGNOSIS — Z1159 Encounter for screening for other viral diseases: Secondary | ICD-10-CM | POA: Diagnosis not present

## 2017-06-08 DIAGNOSIS — R3121 Asymptomatic microscopic hematuria: Secondary | ICD-10-CM | POA: Diagnosis not present

## 2017-06-08 DIAGNOSIS — N178 Other acute kidney failure: Secondary | ICD-10-CM | POA: Diagnosis not present

## 2017-06-15 ENCOUNTER — Other Ambulatory Visit: Payer: Self-pay | Admitting: Internal Medicine

## 2017-06-15 DIAGNOSIS — N182 Chronic kidney disease, stage 2 (mild): Secondary | ICD-10-CM

## 2017-07-18 DIAGNOSIS — K5900 Constipation, unspecified: Secondary | ICD-10-CM | POA: Diagnosis not present

## 2017-07-18 DIAGNOSIS — N182 Chronic kidney disease, stage 2 (mild): Secondary | ICD-10-CM | POA: Diagnosis not present

## 2017-07-18 DIAGNOSIS — Z6839 Body mass index (BMI) 39.0-39.9, adult: Secondary | ICD-10-CM | POA: Diagnosis not present

## 2017-07-25 ENCOUNTER — Ambulatory Visit
Admission: RE | Admit: 2017-07-25 | Discharge: 2017-07-25 | Disposition: A | Discharge status: Home / Self Care | Payer: Self-pay | Source: Ambulatory Visit | Attending: Internal Medicine | Admitting: Internal Medicine

## 2017-07-25 DIAGNOSIS — N189 Chronic kidney disease, unspecified: Secondary | ICD-10-CM | POA: Diagnosis not present

## 2017-07-25 DIAGNOSIS — N182 Chronic kidney disease, stage 2 (mild): Secondary | ICD-10-CM

## 2017-11-17 DIAGNOSIS — R1031 Right lower quadrant pain: Secondary | ICD-10-CM | POA: Diagnosis not present

## 2017-11-17 DIAGNOSIS — Z202 Contact with and (suspected) exposure to infections with a predominantly sexual mode of transmission: Secondary | ICD-10-CM | POA: Diagnosis not present

## 2017-11-17 DIAGNOSIS — K648 Other hemorrhoids: Secondary | ICD-10-CM | POA: Diagnosis not present

## 2017-11-17 DIAGNOSIS — N182 Chronic kidney disease, stage 2 (mild): Secondary | ICD-10-CM | POA: Diagnosis not present

## 2017-11-21 ENCOUNTER — Other Ambulatory Visit: Payer: Self-pay | Admitting: Internal Medicine

## 2017-11-21 DIAGNOSIS — R1031 Right lower quadrant pain: Secondary | ICD-10-CM

## 2017-12-06 ENCOUNTER — Other Ambulatory Visit: Payer: BLUE CROSS/BLUE SHIELD

## 2017-12-12 DIAGNOSIS — R51 Headache: Secondary | ICD-10-CM | POA: Diagnosis not present

## 2017-12-12 DIAGNOSIS — R109 Unspecified abdominal pain: Secondary | ICD-10-CM | POA: Diagnosis not present

## 2017-12-12 DIAGNOSIS — M545 Low back pain: Secondary | ICD-10-CM | POA: Diagnosis not present

## 2018-04-17 DIAGNOSIS — G8929 Other chronic pain: Secondary | ICD-10-CM | POA: Diagnosis not present

## 2018-04-17 DIAGNOSIS — M79605 Pain in left leg: Secondary | ICD-10-CM | POA: Diagnosis not present

## 2018-10-23 DIAGNOSIS — R1013 Epigastric pain: Secondary | ICD-10-CM | POA: Diagnosis not present

## 2018-10-23 DIAGNOSIS — Z131 Encounter for screening for diabetes mellitus: Secondary | ICD-10-CM | POA: Diagnosis not present

## 2018-10-23 DIAGNOSIS — Z136 Encounter for screening for cardiovascular disorders: Secondary | ICD-10-CM | POA: Diagnosis not present

## 2018-10-23 DIAGNOSIS — M791 Myalgia, unspecified site: Secondary | ICD-10-CM | POA: Diagnosis not present

## 2018-10-26 DIAGNOSIS — Z136 Encounter for screening for cardiovascular disorders: Secondary | ICD-10-CM | POA: Diagnosis not present

## 2018-10-26 DIAGNOSIS — R1013 Epigastric pain: Secondary | ICD-10-CM | POA: Diagnosis not present

## 2018-11-02 ENCOUNTER — Other Ambulatory Visit: Payer: Self-pay | Admitting: Internal Medicine

## 2018-11-02 DIAGNOSIS — R1013 Epigastric pain: Secondary | ICD-10-CM

## 2018-11-07 IMAGING — US US RENAL
1 series · 14 of 25 positions shown · non-contrast
Comparison: None.

CLINICAL DATA: Chronic renal disease.

EXAM:
RENAL / URINARY TRACT ULTRASOUND COMPLETE

[Series 1: us renal · 0.21mm/px · 14 of 37 slices shown]
[im 1/37]
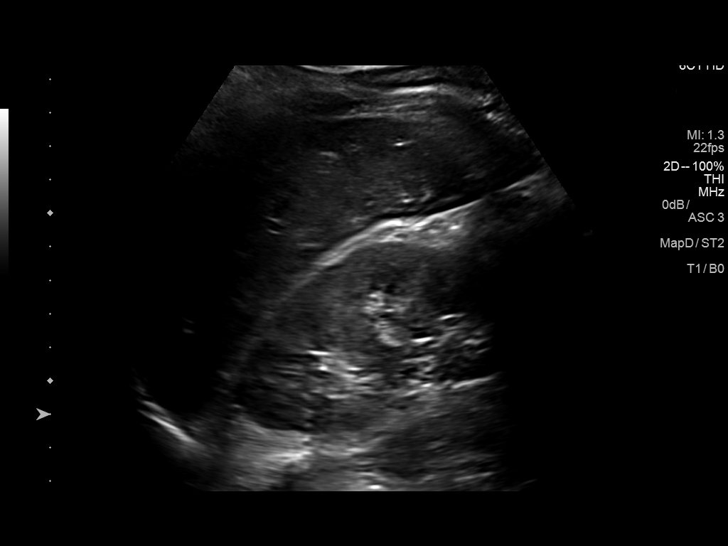
[im 4/37]
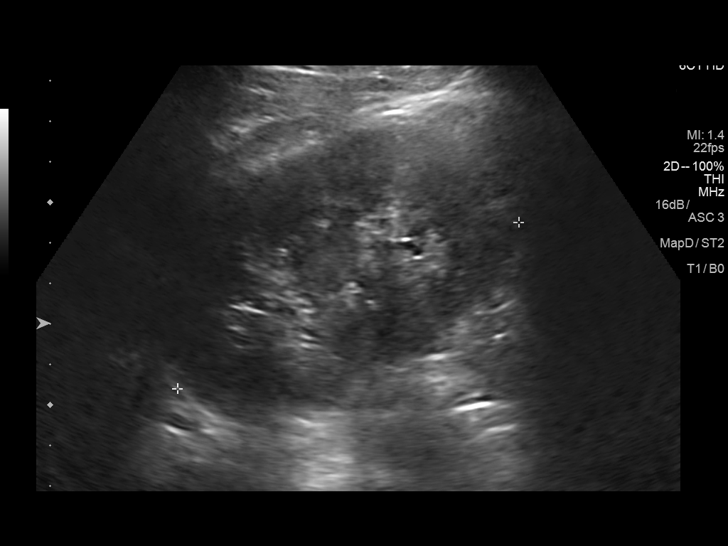
[im 7/37]
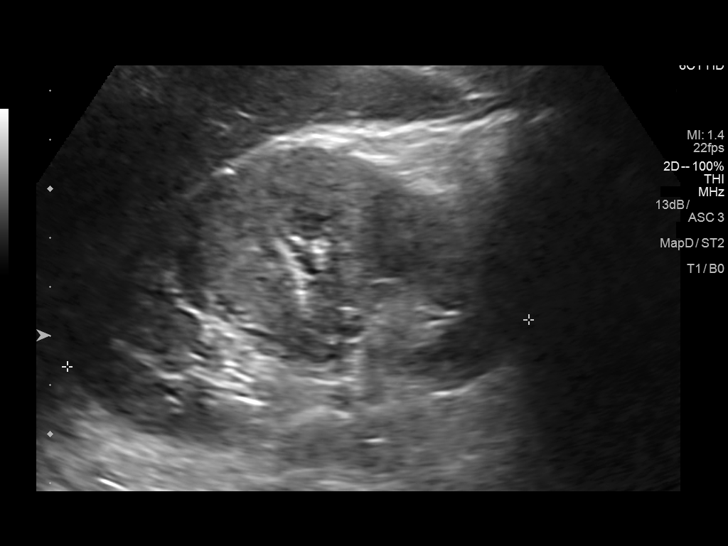
[im 10/37]
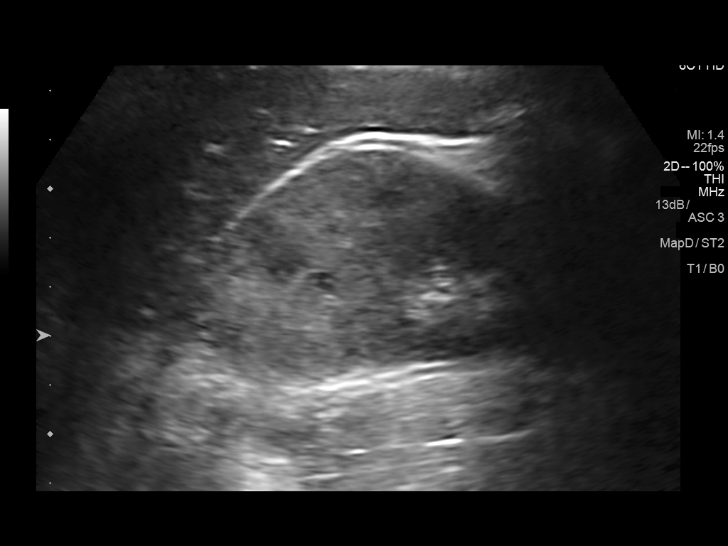
[im 13/37]
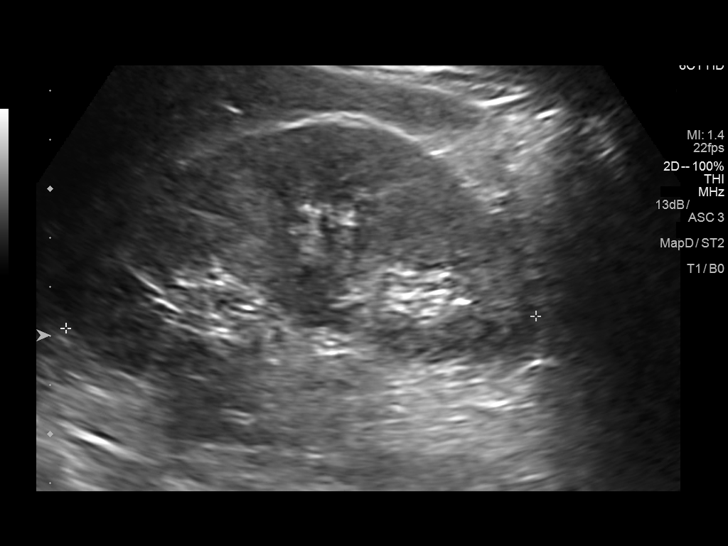
[im 14/37]
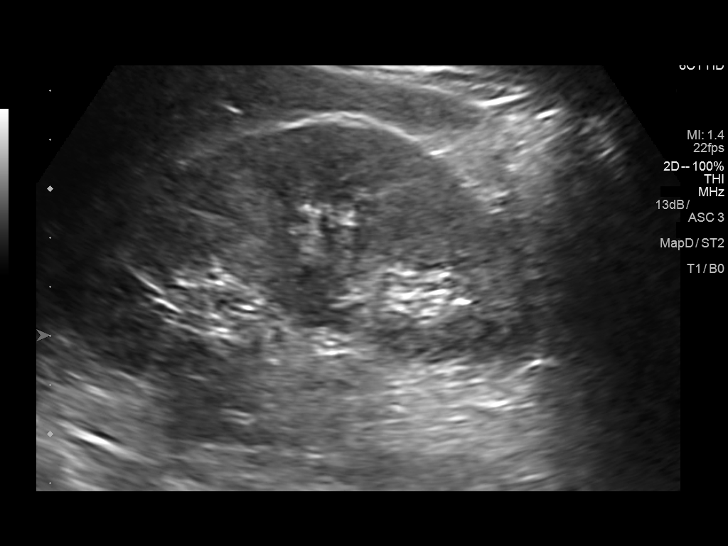
[im 17/37]
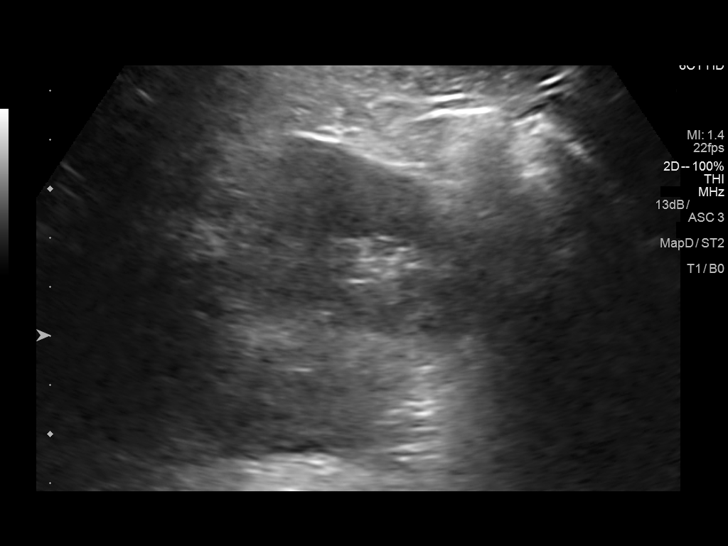
[im 20/37]
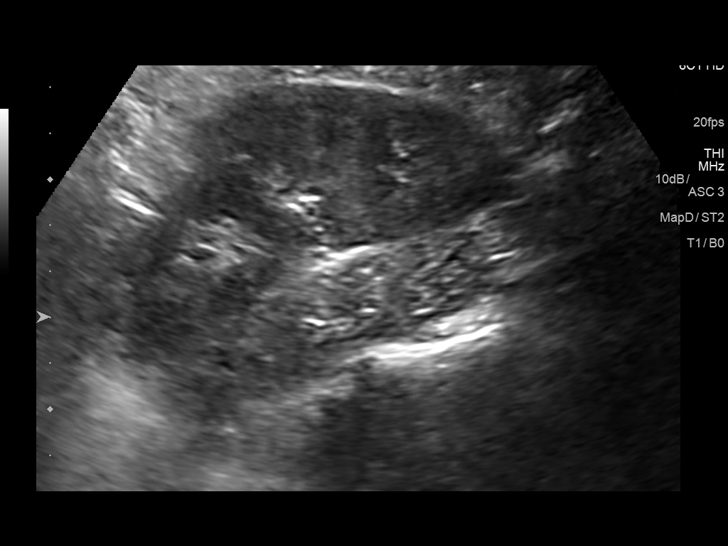
[im 23/37]
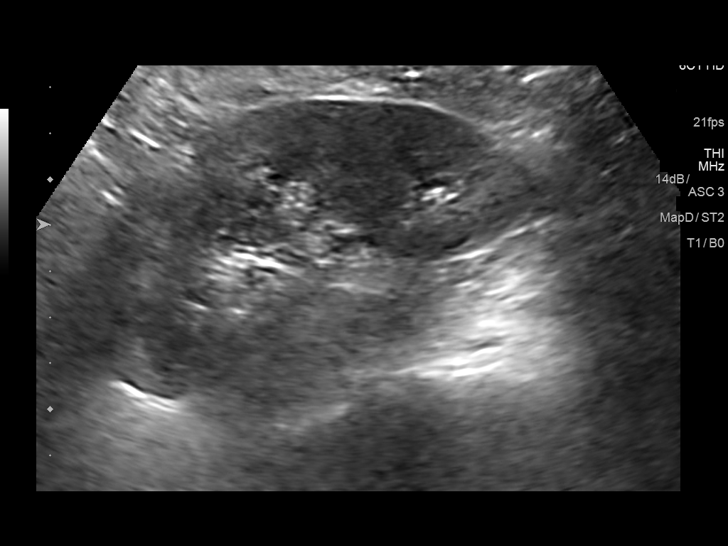
[im 25/37]
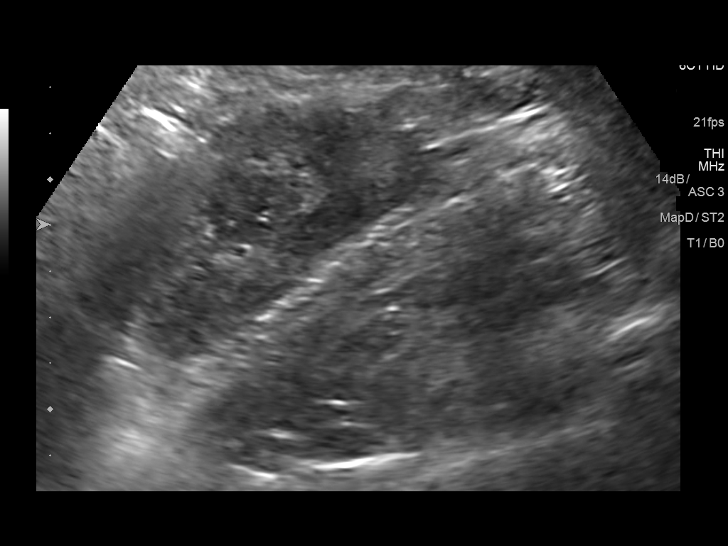
[im 28/37]
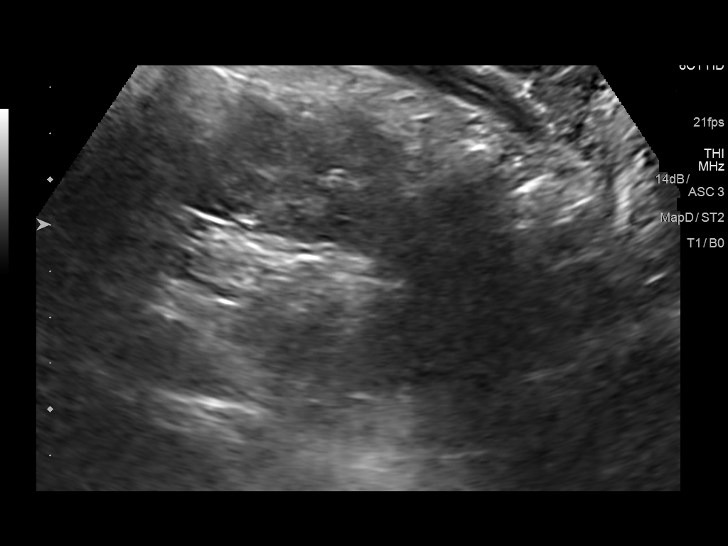
[im 31/37]
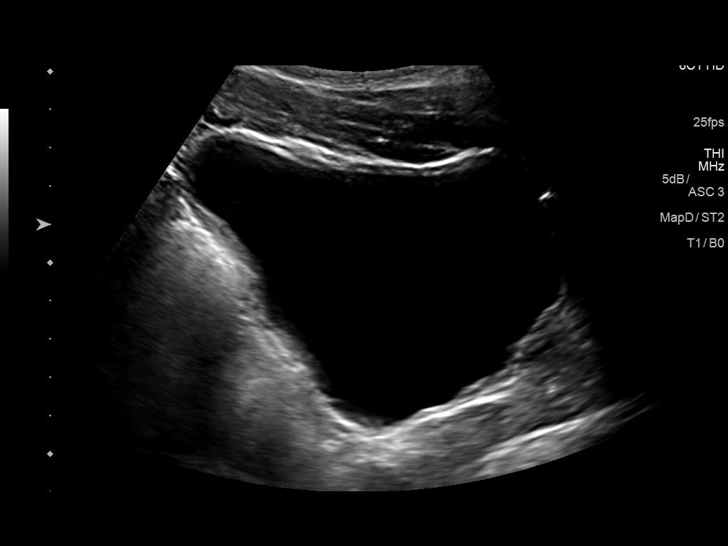
[im 34/37]
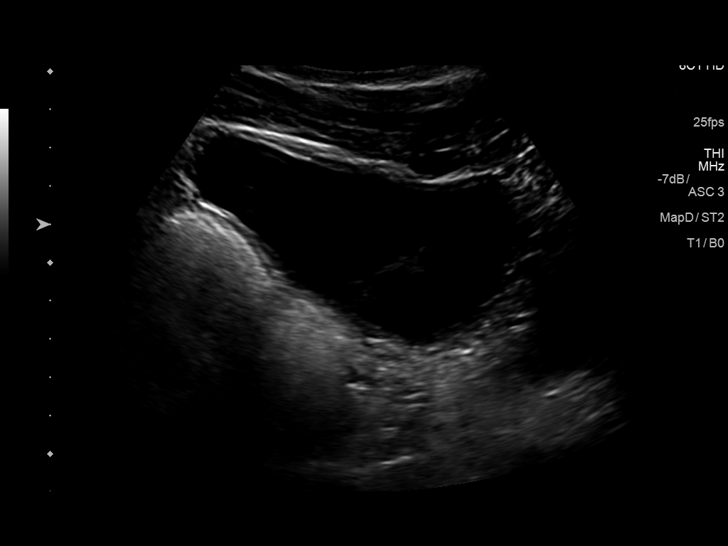
[im 37/37]
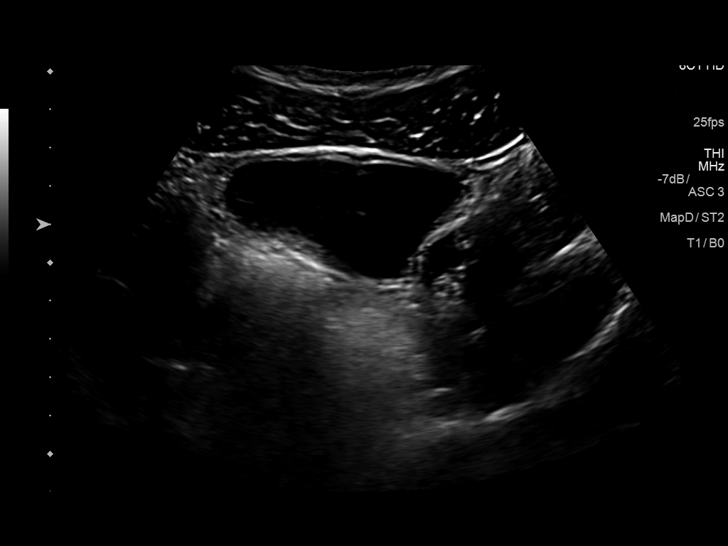

[14 of 25 positions shown; findings below may reference images not displayed]

FINDINGS: Right Kidney:

Length: 9.5 cm. Echogenicity within normal limits. No mass or
hydronephrosis visualized.

Left Kidney:

Length: 9.6 cm. Echogenicity within normal limits. No mass or
hydronephrosis visualized.

Bladder:

Appears normal for degree of bladder distention.
IMPRESSION: Normal renal ultrasound.

## 2018-11-16 ENCOUNTER — Ambulatory Visit
Admission: RE | Admit: 2018-11-16 | Discharge: 2018-11-16 | Disposition: A | Discharge status: Home / Self Care | Payer: BC Managed Care – PPO | Source: Ambulatory Visit | Attending: Internal Medicine | Admitting: Internal Medicine

## 2018-11-16 DIAGNOSIS — R1013 Epigastric pain: Secondary | ICD-10-CM

## 2019-02-25 DIAGNOSIS — Z130 Encounter for screening for diseases of the blood and blood-forming organs and certain disorders involving the immune mechanism: Secondary | ICD-10-CM | POA: Diagnosis not present

## 2019-02-25 DIAGNOSIS — Z13220 Encounter for screening for lipoid disorders: Secondary | ICD-10-CM | POA: Diagnosis not present

## 2019-02-25 DIAGNOSIS — N182 Chronic kidney disease, stage 2 (mild): Secondary | ICD-10-CM | POA: Diagnosis not present

## 2019-02-25 DIAGNOSIS — R809 Proteinuria, unspecified: Secondary | ICD-10-CM | POA: Diagnosis not present

## 2019-02-25 DIAGNOSIS — N400 Enlarged prostate without lower urinary tract symptoms: Secondary | ICD-10-CM | POA: Diagnosis not present

## 2019-02-25 DIAGNOSIS — N181 Chronic kidney disease, stage 1: Secondary | ICD-10-CM | POA: Diagnosis not present

## 2019-02-25 DIAGNOSIS — Z7251 High risk heterosexual behavior: Secondary | ICD-10-CM | POA: Diagnosis not present

## 2019-03-04 DIAGNOSIS — E559 Vitamin D deficiency, unspecified: Secondary | ICD-10-CM | POA: Diagnosis not present

## 2019-03-04 DIAGNOSIS — N400 Enlarged prostate without lower urinary tract symptoms: Secondary | ICD-10-CM | POA: Diagnosis not present

## 2019-03-04 DIAGNOSIS — A630 Anogenital (venereal) warts: Secondary | ICD-10-CM | POA: Diagnosis not present

## 2019-03-04 DIAGNOSIS — N182 Chronic kidney disease, stage 2 (mild): Secondary | ICD-10-CM | POA: Diagnosis not present

## 2019-04-29 DIAGNOSIS — L02412 Cutaneous abscess of left axilla: Secondary | ICD-10-CM | POA: Diagnosis not present

## 2019-06-17 DIAGNOSIS — L02412 Cutaneous abscess of left axilla: Secondary | ICD-10-CM | POA: Diagnosis not present

## 2019-06-17 DIAGNOSIS — N182 Chronic kidney disease, stage 2 (mild): Secondary | ICD-10-CM | POA: Diagnosis not present

## 2019-06-17 DIAGNOSIS — L0291 Cutaneous abscess, unspecified: Secondary | ICD-10-CM | POA: Diagnosis not present

## 2019-06-17 DIAGNOSIS — Z13220 Encounter for screening for lipoid disorders: Secondary | ICD-10-CM | POA: Diagnosis not present

## 2019-06-17 DIAGNOSIS — E559 Vitamin D deficiency, unspecified: Secondary | ICD-10-CM | POA: Diagnosis not present

## 2019-06-17 DIAGNOSIS — Z13228 Encounter for screening for other metabolic disorders: Secondary | ICD-10-CM | POA: Diagnosis not present

## 2019-06-17 DIAGNOSIS — B070 Plantar wart: Secondary | ICD-10-CM | POA: Diagnosis not present

## 2019-06-18 DIAGNOSIS — L02411 Cutaneous abscess of right axilla: Secondary | ICD-10-CM | POA: Diagnosis not present

## 2019-06-28 IMAGING — US ULTRASOUND ABDOMEN LIMITED
1 series · 14 of 25 positions shown · non-contrast
Comparison: None.

CLINICAL DATA: Epigastric abdominal pain.

EXAM:
ULTRASOUND ABDOMEN LIMITED RIGHT UPPER QUADRANT

[Series 1: ultrasound abdomen limited · 0.15mm/px · 14 of 45 slices shown]
[im 1/45]
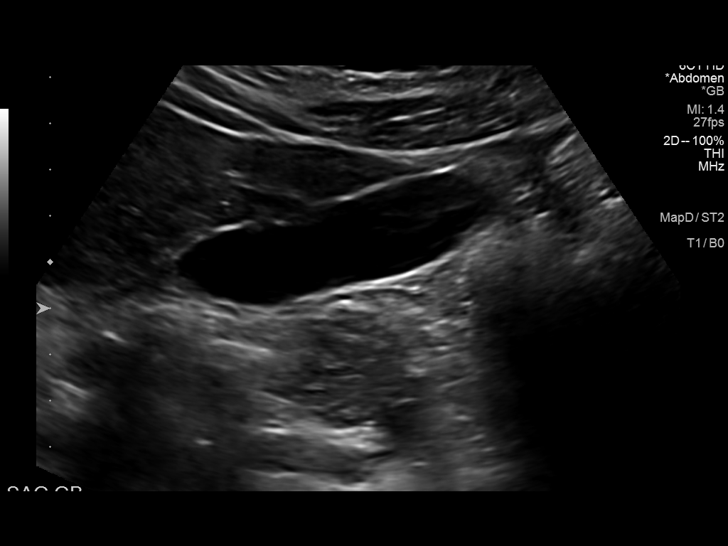
[im 4/45]
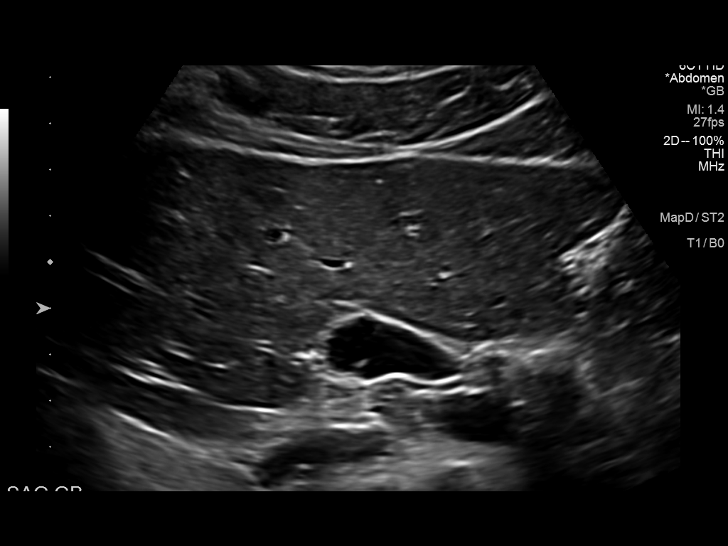
[im 8/45]
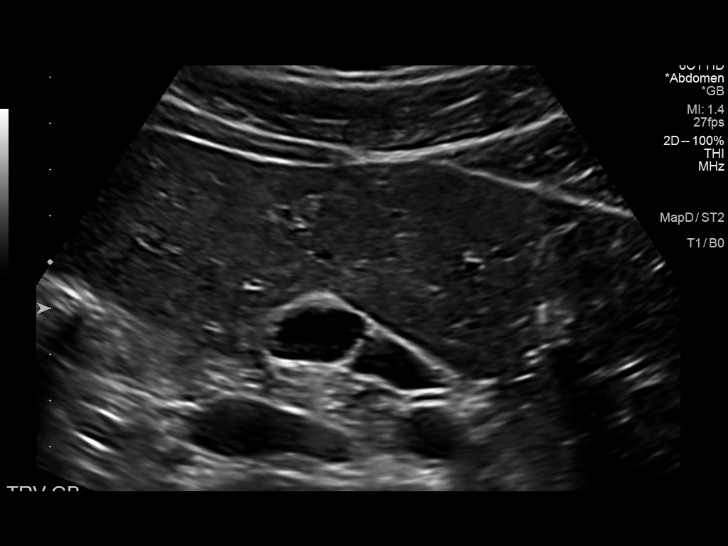
[im 12/45]
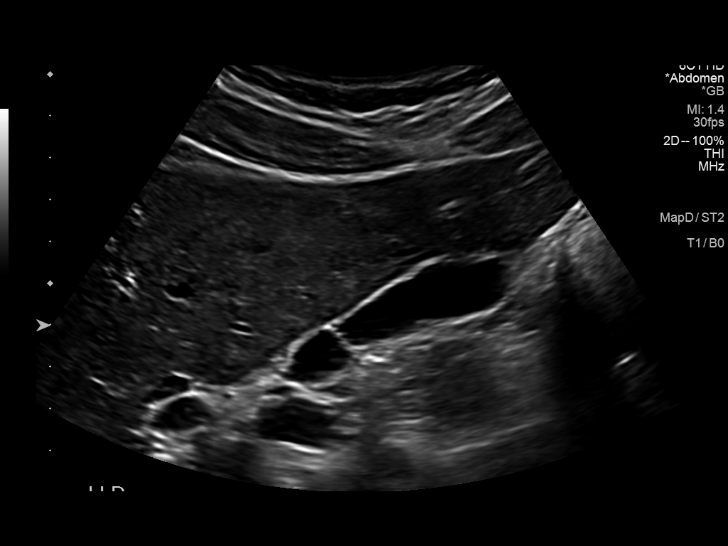
[im 15/45]
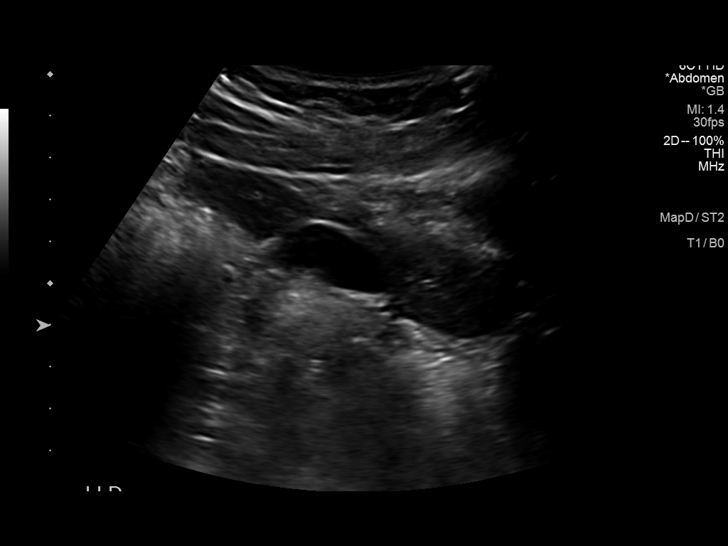
[im 17/45]
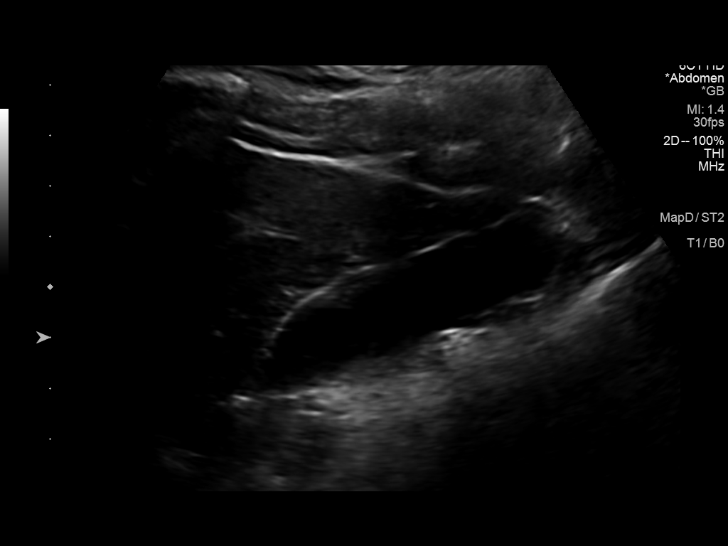
[im 21/45]
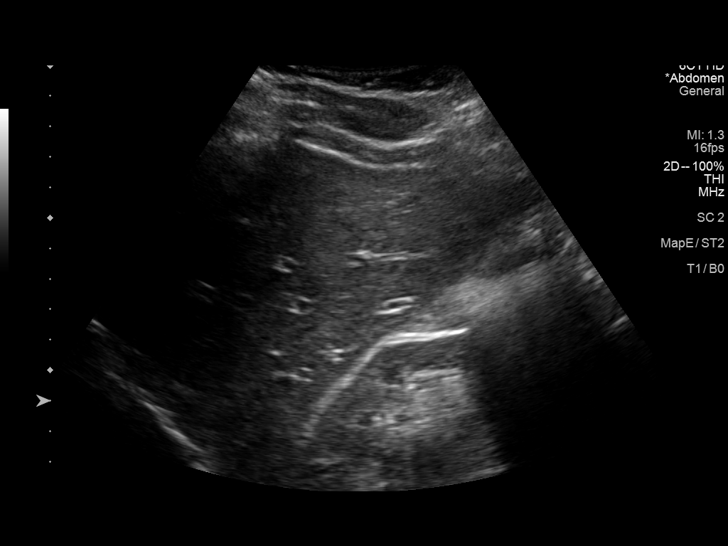
[im 24/45]
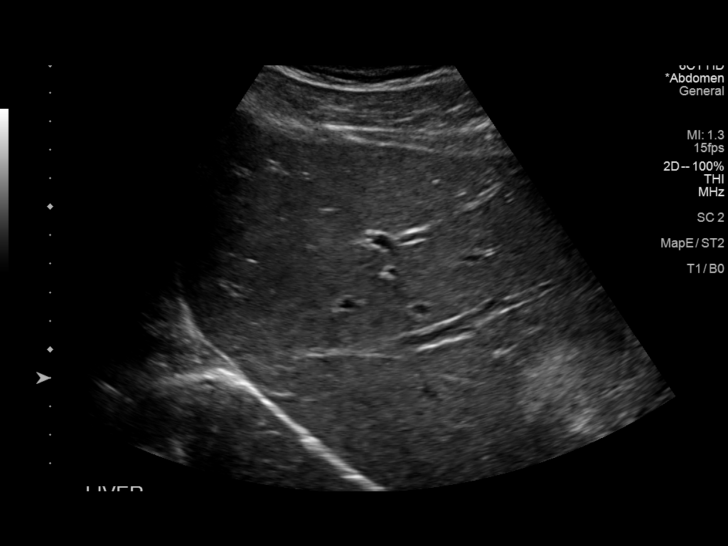
[im 28/45]
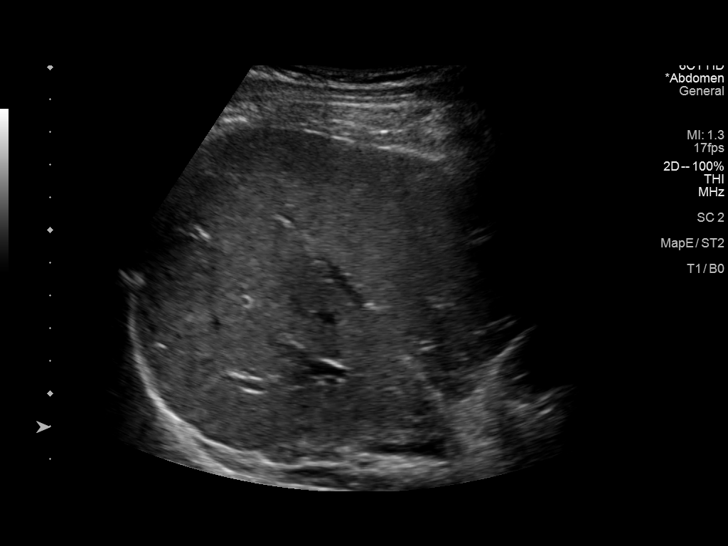
[im 30/45]
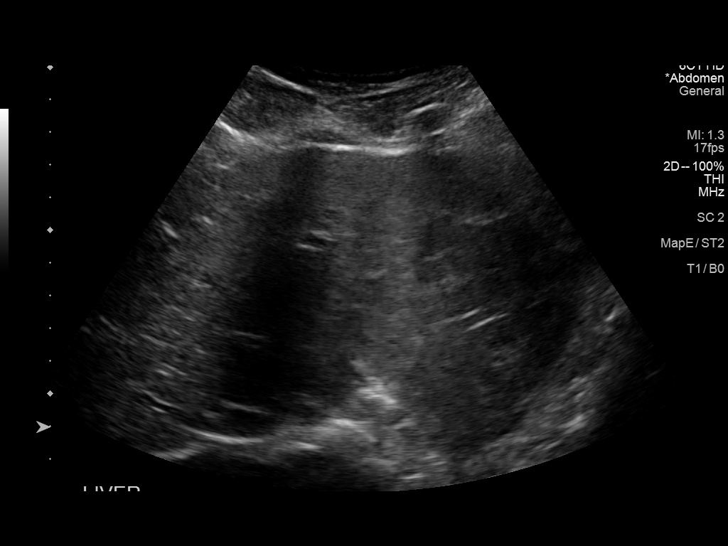
[im 34/45]
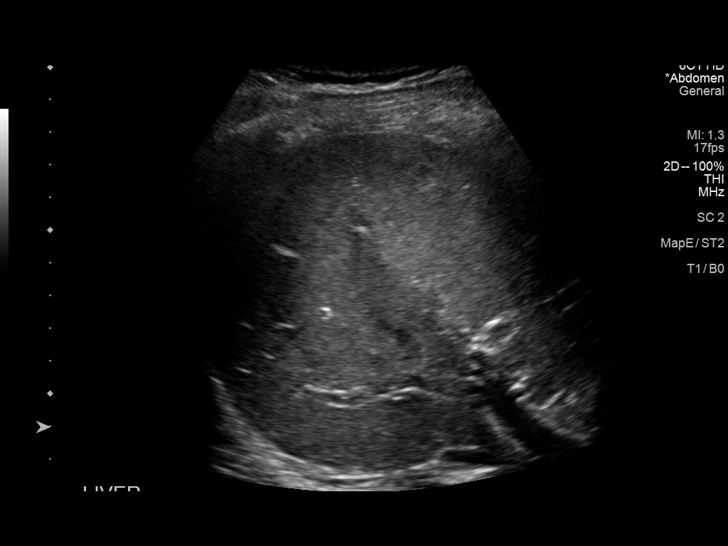
[im 37/45]
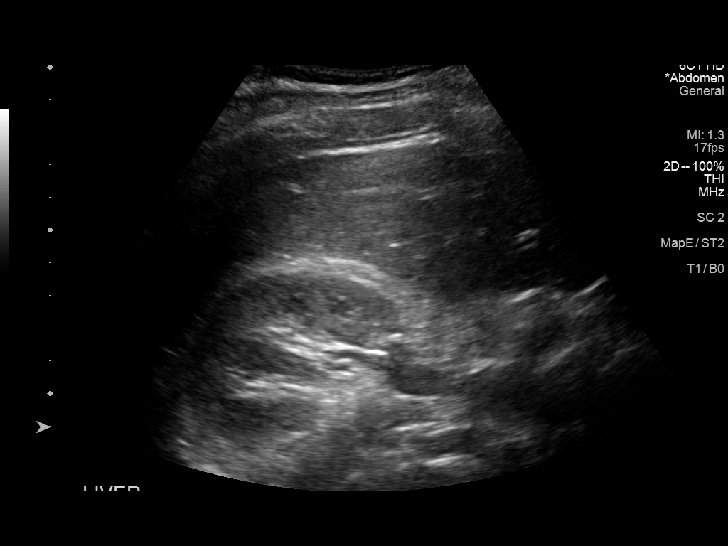
[im 41/45]
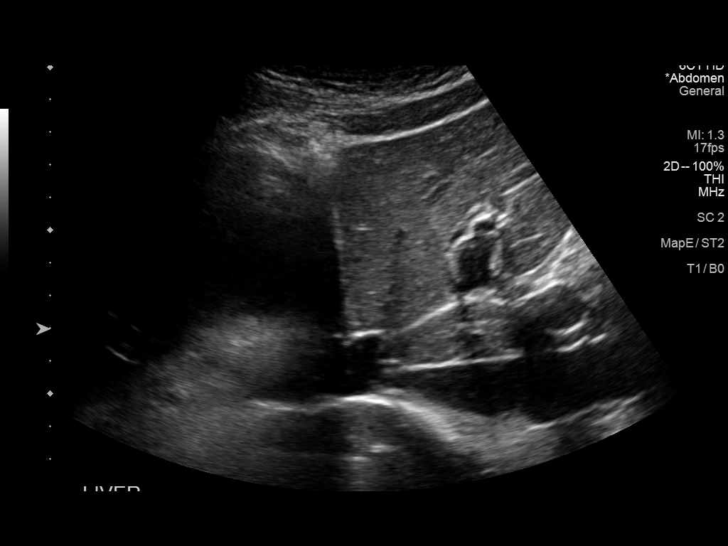
[im 45/45]
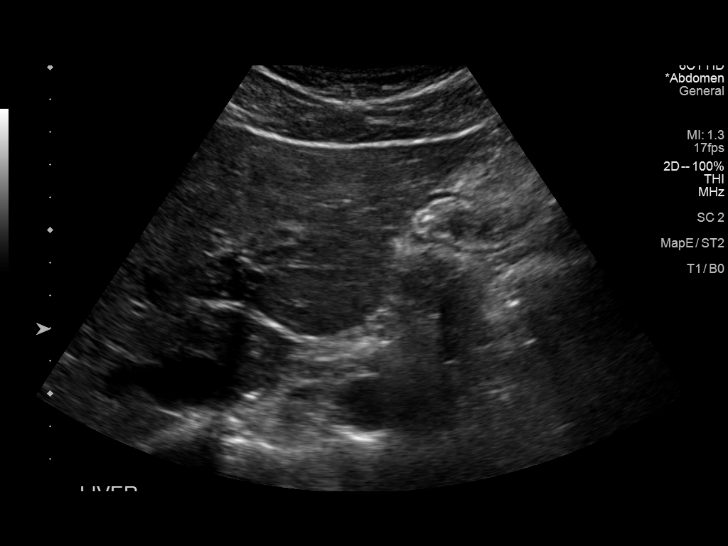

[14 of 25 positions shown; findings below may reference images not displayed]

FINDINGS: Gallbladder:

No gallstones or wall thickening visualized. No sonographic Murphy
sign noted by sonographer.

Common bile duct:

Diameter: 2 mm, within normal limits.

Liver:

No focal lesion identified. Within normal limits in parenchymal
echogenicity. Portal vein is patent on color Doppler imaging with
normal direction of blood flow towards the liver.
IMPRESSION: Negative.  No hepatobiliary abnormality identified.

## 2019-08-02 DIAGNOSIS — L02412 Cutaneous abscess of left axilla: Secondary | ICD-10-CM | POA: Diagnosis not present

## 2019-08-02 DIAGNOSIS — N182 Chronic kidney disease, stage 2 (mild): Secondary | ICD-10-CM | POA: Diagnosis not present

## 2019-08-02 DIAGNOSIS — N400 Enlarged prostate without lower urinary tract symptoms: Secondary | ICD-10-CM | POA: Diagnosis not present

## 2019-08-02 DIAGNOSIS — B070 Plantar wart: Secondary | ICD-10-CM | POA: Diagnosis not present

## 2019-08-22 ENCOUNTER — Ambulatory Visit: Payer: Self-pay | Attending: Family

## 2019-08-22 DIAGNOSIS — Z23 Encounter for immunization: Secondary | ICD-10-CM

## 2019-08-22 NOTE — Progress Notes (Signed)
   Covid-19 Vaccination Clinic  Name:  Dean Parrish    MRN: [id number] DOB: 08/06/1985  08/22/2019  Dean Parrish was observed post Covid-19 immunization for 15 minutes without incident. He was provided with Vaccine Information Sheet and instruction to access the V-Safe system.   Dean Parrish was instructed to call 911 with any severe reactions post vaccine: Marland Kitchen Difficulty breathing  . Swelling of face and throat  . A fast heartbeat  . A bad rash all over body  . Dizziness and weakness   Immunizations Administered    Name Date Dose VIS Date Route   Moderna COVID-19 Vaccine 08/22/2019  1:17 PM 0.5 mL 05/07/2019 Intramuscular   Manufacturer: Moderna   Lot: [id number]   NDC: 30865-784-69

## 2019-09-24 ENCOUNTER — Ambulatory Visit: Payer: Self-pay | Attending: Family

## 2019-09-24 DIAGNOSIS — Z23 Encounter for immunization: Secondary | ICD-10-CM

## 2019-09-24 NOTE — Progress Notes (Signed)
   Covid-19 Vaccination Clinic  Name:  Dean Parrish    MRN: [id number] DOB: January 26, 1986  09/24/2019  Mr. [name] was observed post Covid-19 immunization for 15 minutes without incident. He was provided with Vaccine Information Sheet and instruction to access the V-Safe system.   Mr. [name] was instructed to call 911 with any severe reactions post vaccine: Marland Kitchen Difficulty breathing  . Swelling of face and throat  . A fast heartbeat  . A bad rash all over body  . Dizziness and weakness   Immunizations Administered    Name Date Dose VIS Date Route   Moderna COVID-19 Vaccine 09/24/2019  1:14 PM 0.5 mL 05/2019 Intramuscular   Manufacturer: Moderna   Lot: [id number]   NDC: 82060-156-15

## 2020-02-06 DIAGNOSIS — S76301S Unspecified injury of muscle, fascia and tendon of the posterior muscle group at thigh level, right thigh, sequela: Secondary | ICD-10-CM | POA: Diagnosis not present

## 2020-02-06 DIAGNOSIS — Z1159 Encounter for screening for other viral diseases: Secondary | ICD-10-CM | POA: Diagnosis not present

## 2020-02-06 DIAGNOSIS — Z0000 Encounter for general adult medical examination without abnormal findings: Secondary | ICD-10-CM | POA: Diagnosis not present

## 2020-02-06 DIAGNOSIS — B070 Plantar wart: Secondary | ICD-10-CM | POA: Diagnosis not present
